# Patient Record
Sex: Female | Born: 1985 | Race: Black or African American | Hispanic: No | Marital: Married
Health system: Southern US, Community
[De-identification: ages and names within clinical notes are randomized; demographics above are authoritative.]

## PROBLEM LIST (undated history)

## (undated) DIAGNOSIS — Z789 Other specified health status: Secondary | ICD-10-CM

## (undated) HISTORY — PX: NO PAST SURGERIES: SHX2092

---

## 2012-09-30 ENCOUNTER — Encounter: Payer: BC Managed Care – PPO | Admitting: Obstetrics and Gynecology

## 2012-10-06 ENCOUNTER — Telehealth: Payer: Self-pay | Admitting: Obstetrics and Gynecology

## 2012-10-07 ENCOUNTER — Encounter: Payer: Self-pay | Admitting: Obstetrics and Gynecology

## 2012-10-07 ENCOUNTER — Ambulatory Visit: Payer: BC Managed Care – PPO | Admitting: Obstetrics and Gynecology

## 2012-10-07 VITALS — BP 110/68 | HR 82 | Ht 65.0 in | Wt 120.0 lb

## 2012-10-07 DIAGNOSIS — N898 Other specified noninflammatory disorders of vagina: Secondary | ICD-10-CM

## 2012-10-07 LAB — POCT WET PREP (WET MOUNT): pH: 5.5

## 2012-10-07 MED ORDER — TINIDAZOLE 500 MG PO TABS: 2.0000 g | ORAL_TABLET | Freq: Every day | ORAL | Status: DC

## 2012-10-07 MED ORDER — LEVONORGESTREL-ETHINYL ESTRAD 0.1-20 MG-MCG PO TABS: 1.0000 | ORAL_TABLET | Freq: Every day | ORAL | Status: DC

## 2012-10-07 NOTE — Addendum Note (Signed)
Addended by: Osborn Coho on: 10/07/2012 01:18 PM   Modules accepted: Orders

## 2012-10-07 NOTE — Addendum Note (Signed)
Addended by: Marla Roe A on: 10/07/2012 05:21 PM   Modules accepted: Orders

## 2012-10-07 NOTE — Progress Notes (Signed)
Contraception condoms  Last pap 01/2011 Last Mammo n/a Last Colonoscopy n/a Last Dexa Scan n/a Primary MD n/a Abuse at Home no   No complaints.  Wants to restart OCPs.  Filed Vitals:   10/07/12 1108  BP: 110/68  Pulse: 82   ROS: noncontributory  Physical Examination: General appearance - alert, well appearing, and in no distress Neck - supple, no significant adenopathy Chest - clear to auscultation, no wheezes, rales or rhonchi, symmetric air entry Heart - normal rate and regular rhythm Abdomen - soft, nontender, nondistended, no masses or organomegaly Breasts - breasts appear normal, no suspicious masses, no skin or nipple changes or axillary nodes Pelvic - normal external genitalia, vulva, vagina, cervix, uterus and adnexa, white d/c Back exam - no CVAT Extremities - no edema, redness or tenderness in the calves or thighs  A/P Used Aviane before and wants to restart - will send rx with refills for the yr Wet prep - BV- tindamax

## 2012-10-08 LAB — PAP IG W/ RFLX HPV ASCU

## 2018-04-16 LAB — OB RESULTS CONSOLE ANTIBODY SCREEN: Antibody Screen: NEGATIVE

## 2018-04-16 LAB — OB RESULTS CONSOLE HIV ANTIBODY (ROUTINE TESTING): HIV: NONREACTIVE

## 2018-04-16 LAB — OB RESULTS CONSOLE GC/CHLAMYDIA
Chlamydia: NEGATIVE
Gonorrhea: NEGATIVE

## 2018-04-16 LAB — OB RESULTS CONSOLE HEPATITIS B SURFACE ANTIGEN: Hepatitis B Surface Ag: NEGATIVE

## 2018-04-16 LAB — OB RESULTS CONSOLE ABO/RH: RH Type: POSITIVE

## 2018-04-16 LAB — OB RESULTS CONSOLE RPR: RPR: NONREACTIVE

## 2018-04-16 LAB — OB RESULTS CONSOLE RUBELLA ANTIBODY, IGM: Rubella: IMMUNE

## 2018-06-17 ENCOUNTER — Other Ambulatory Visit (HOSPITAL_COMMUNITY): Payer: Self-pay | Admitting: Obstetrics and Gynecology

## 2018-06-17 DIAGNOSIS — Z3A19 19 weeks gestation of pregnancy: Secondary | ICD-10-CM

## 2018-06-17 DIAGNOSIS — Z363 Encounter for antenatal screening for malformations: Secondary | ICD-10-CM

## 2018-06-30 ENCOUNTER — Encounter (HOSPITAL_COMMUNITY): Payer: Self-pay

## 2018-07-07 ENCOUNTER — Ambulatory Visit (HOSPITAL_COMMUNITY)
Admission: RE | Admit: 2018-07-07 | Discharge: 2018-07-07 | Disposition: A | Discharge status: Home / Self Care | Payer: Managed Care, Other (non HMO) | Source: Ambulatory Visit | Attending: Obstetrics and Gynecology | Admitting: Obstetrics and Gynecology

## 2018-07-07 DIAGNOSIS — Z363 Encounter for antenatal screening for malformations: Secondary | ICD-10-CM | POA: Insufficient documentation

## 2018-07-07 DIAGNOSIS — Z3A19 19 weeks gestation of pregnancy: Secondary | ICD-10-CM | POA: Insufficient documentation

## 2018-08-18 NOTE — L&D Delivery Note (Signed)
Delivery Note Patient pushed for approximately 30 minutes after she was noted to be C/C/+2. AROM was performed clear fluid. At 11:16 AM a viable and healthy female was delivered via Vaginal, Spontaneous (Presentation:OA). Shoulders and body easily delivered and baby laid on maternal abdomen. Baby noted to have vigorous cry.  APGAR: 9, 9; weight pending. Delayed cord clamping was done and then cut by father.  Cord blood obtained.    Placenta spontaneously delivered intact, 3 vessels.  There were no lacerations noted.  Uterine atony alleviated by IV pitocin and massage.  There were no complications.  COVID 19 precautions were taken  Anesthesia: pudendal  Episiotomy: None Lacerations: None Suture Repair: n/a Est. Blood Loss (mL): 50  Mom to postpartum.  Baby to Couplet care / Skin to Skin.  Essie Hart STACIA 11/16/2018, 11:38 AM

## 2018-11-03 LAB — OB RESULTS CONSOLE GBS: GBS: POSITIVE

## 2018-11-16 ENCOUNTER — Inpatient Hospital Stay (HOSPITAL_COMMUNITY)
Admission: AD | Admit: 2018-11-16 | Discharge: 2018-11-18 | DRG: 807 | Disposition: A | Discharge status: Home / Self Care | Payer: Managed Care, Other (non HMO) | Attending: Obstetrics & Gynecology | Admitting: Obstetrics & Gynecology

## 2018-11-16 ENCOUNTER — Encounter (HOSPITAL_COMMUNITY): Payer: Self-pay

## 2018-11-16 ENCOUNTER — Other Ambulatory Visit: Payer: Self-pay

## 2018-11-16 DIAGNOSIS — O26893 Other specified pregnancy related conditions, third trimester: Secondary | ICD-10-CM | POA: Diagnosis present

## 2018-11-16 DIAGNOSIS — Z3A37 37 weeks gestation of pregnancy: Secondary | ICD-10-CM

## 2018-11-16 DIAGNOSIS — O99824 Streptococcus B carrier state complicating childbirth: Principal | ICD-10-CM | POA: Diagnosis present

## 2018-11-16 HISTORY — DX: Other specified health status: Z78.9

## 2018-11-16 LAB — TYPE AND SCREEN
ABO/RH(D): B POS
Antibody Screen: NEGATIVE

## 2018-11-16 LAB — CBC
HEMATOCRIT: 41.8 % (ref 36.0–46.0)
Hemoglobin: 13.5 g/dL (ref 12.0–15.0)
MCH: 28.1 pg (ref 26.0–34.0)
MCHC: 32.3 g/dL (ref 30.0–36.0)
MCV: 86.9 fL (ref 80.0–100.0)
Platelets: 257 10*3/uL (ref 150–400)
RBC: 4.81 MIL/uL (ref 3.87–5.11)
RDW: 15.1 % (ref 11.5–15.5)
WBC: 10.2 10*3/uL (ref 4.0–10.5)
nRBC: 0 % (ref 0.0–0.2)

## 2018-11-16 MED ORDER — OXYTOCIN 40 UNITS IN NORMAL SALINE INFUSION - SIMPLE MED
INTRAVENOUS | Status: AC
  Filled 2018-11-16: qty 1000

## 2018-11-16 MED ORDER — DIPHENHYDRAMINE HCL 25 MG PO CAPS: 25.0000 mg | ORAL_CAPSULE | Freq: Four times a day (QID) | ORAL | Status: DC | PRN

## 2018-11-16 MED ORDER — COCONUT OIL OIL
1.0000 "application " | TOPICAL_OIL | Status: DC | PRN
  Administered 2018-11-17: 1 via TOPICAL

## 2018-11-16 MED ORDER — IBUPROFEN 600 MG PO TABS
600.0000 mg | ORAL_TABLET | Freq: Four times a day (QID) | ORAL | Status: DC
  Administered 2018-11-16 – 2018-11-18 (×7): 600 mg via ORAL
  Filled 2018-11-16 (×7): qty 1

## 2018-11-16 MED ORDER — OXYCODONE-ACETAMINOPHEN 5-325 MG PO TABS: 1.0000 | ORAL_TABLET | ORAL | Status: DC | PRN

## 2018-11-16 MED ORDER — LACTATED RINGERS IV SOLN
INTRAVENOUS | Status: DC
  Administered 2018-11-16: 11:00:00 via INTRAVENOUS

## 2018-11-16 MED ORDER — FLEET ENEMA 7-19 GM/118ML RE ENEM: 1.0000 | ENEMA | RECTAL | Status: DC | PRN

## 2018-11-16 MED ORDER — METHYLERGONOVINE MALEATE 0.2 MG PO TABS: 0.2000 mg | ORAL_TABLET | ORAL | Status: DC | PRN

## 2018-11-16 MED ORDER — DIBUCAINE 1 % RE OINT: 1.0000 "application " | TOPICAL_OINTMENT | RECTAL | Status: DC | PRN

## 2018-11-16 MED ORDER — SIMETHICONE 80 MG PO CHEW: 80.0000 mg | CHEWABLE_TABLET | ORAL | Status: DC | PRN

## 2018-11-16 MED ORDER — SOD CITRATE-CITRIC ACID 500-334 MG/5ML PO SOLN: 30.0000 mL | ORAL | Status: DC | PRN

## 2018-11-16 MED ORDER — ONDANSETRON HCL 4 MG PO TABS: 4.0000 mg | ORAL_TABLET | ORAL | Status: DC | PRN

## 2018-11-16 MED ORDER — LIDOCAINE HCL (PF) 1 % IJ SOLN
30.0000 mL | INTRAMUSCULAR | Status: AC | PRN
  Administered 2018-11-16: 30 mL via SUBCUTANEOUS

## 2018-11-16 MED ORDER — ACETAMINOPHEN 325 MG PO TABS
650.0000 mg | ORAL_TABLET | ORAL | Status: DC | PRN
  Administered 2018-11-16: 650 mg via ORAL
  Filled 2018-11-16: qty 2

## 2018-11-16 MED ORDER — TETANUS-DIPHTH-ACELL PERTUSSIS 5-2.5-18.5 LF-MCG/0.5 IM SUSP: 0.5000 mL | Freq: Once | INTRAMUSCULAR | Status: DC

## 2018-11-16 MED ORDER — WITCH HAZEL-GLYCERIN EX PADS: 1.0000 "application " | MEDICATED_PAD | CUTANEOUS | Status: DC | PRN

## 2018-11-16 MED ORDER — LACTATED RINGERS IV SOLN: INTRAVENOUS | Status: DC

## 2018-11-16 MED ORDER — CLINDAMYCIN PHOSPHATE 900 MG/50ML IV SOLN
900.0000 mg | Freq: Once | INTRAVENOUS | Status: AC
  Administered 2018-11-16: 900 mg via INTRAVENOUS
  Filled 2018-11-16: qty 50

## 2018-11-16 MED ORDER — OXYCODONE-ACETAMINOPHEN 5-325 MG PO TABS: 2.0000 | ORAL_TABLET | ORAL | Status: DC | PRN

## 2018-11-16 MED ORDER — PRENATAL MULTIVITAMIN CH
1.0000 | ORAL_TABLET | Freq: Every day | ORAL | Status: DC
  Administered 2018-11-17: 1 via ORAL
  Filled 2018-11-16: qty 1

## 2018-11-16 MED ORDER — BENZOCAINE-MENTHOL 20-0.5 % EX AERO
1.0000 "application " | INHALATION_SPRAY | CUTANEOUS | Status: DC | PRN
  Administered 2018-11-16: 1 via TOPICAL
  Filled 2018-11-16: qty 56

## 2018-11-16 MED ORDER — METHYLERGONOVINE MALEATE 0.2 MG/ML IJ SOLN: 0.2000 mg | INTRAMUSCULAR | Status: DC | PRN

## 2018-11-16 MED ORDER — LACTATED RINGERS IV SOLN: 500.0000 mL | INTRAVENOUS | Status: DC | PRN

## 2018-11-16 MED ORDER — ACETAMINOPHEN 325 MG PO TABS: 650.0000 mg | ORAL_TABLET | ORAL | Status: DC | PRN

## 2018-11-16 MED ORDER — SENNOSIDES-DOCUSATE SODIUM 8.6-50 MG PO TABS
2.0000 | ORAL_TABLET | ORAL | Status: DC
  Administered 2018-11-17 – 2018-11-18 (×2): 2 via ORAL
  Filled 2018-11-16 (×2): qty 2

## 2018-11-16 MED ORDER — OXYTOCIN BOLUS FROM INFUSION
500.0000 mL | Freq: Once | INTRAVENOUS | Status: AC
  Administered 2018-11-16: 500 mL/h via INTRAVENOUS

## 2018-11-16 MED ORDER — ONDANSETRON HCL 4 MG/2ML IJ SOLN: 4.0000 mg | Freq: Four times a day (QID) | INTRAMUSCULAR | Status: DC | PRN

## 2018-11-16 MED ORDER — ZOLPIDEM TARTRATE 5 MG PO TABS: 5.0000 mg | ORAL_TABLET | Freq: Every evening | ORAL | Status: DC | PRN

## 2018-11-16 MED ORDER — LIDOCAINE HCL (PF) 1 % IJ SOLN
INTRAMUSCULAR | Status: AC
  Filled 2018-11-16: qty 30

## 2018-11-16 MED ORDER — ONDANSETRON HCL 4 MG/2ML IJ SOLN: 4.0000 mg | INTRAMUSCULAR | Status: DC | PRN

## 2018-11-16 MED ORDER — OXYTOCIN 40 UNITS IN NORMAL SALINE INFUSION - SIMPLE MED: 2.5000 [IU]/h | INTRAVENOUS | Status: DC

## 2018-11-16 NOTE — MAU Note (Signed)
Contractions started at 0500.  No bleeding or leaking, small amt of pinkish mucous.  No problems with preg.  Was 1 cm last wk

## 2018-11-16 NOTE — H&P (Signed)
Jennifer May is a 33 y.o. female presenting for regular painful contractions that started at 0500 am this morning.  She advanced quickly in the MAU from 4cm to 8cm.   She denies leaking of fluid, reports good fetal movement Prenatal care only remarkable for presence of hemoglobin F.   OB History    Gravida  1   Para  1   Term  1   Preterm  0   AB  0   Living  1     SAB  0   TAB  0   Ectopic  0   Multiple  0   Live Births  1          Past Medical History:  Diagnosis Date  . Medical history non-contributory    Past Surgical History:  Procedure Laterality Date  . NO PAST SURGERIES     Family History: family history includes Diabetes in her maternal aunt and maternal grandmother; Hypertension in her mother. Social History:  reports that she has never smoked. She has never used smokeless tobacco. She reports previous alcohol use. She reports that she does not use drugs.     Maternal Diabetes: No Genetic Screening: Normal Quad screen negative Maternal Ultrasounds/Referrals: Normal Fetal Ultrasounds or other Referrals:  None Maternal Substance Abuse:  No Significant Maternal Medications:  None Significant Maternal Lab Results:  Lab values include: Group B Strep positive Other Comments:  None  ROS History Dilation: 10 Effacement (%): 100 Station: Plus 2 Exam by:: Dr Mora Appl Blood pressure 107/61, pulse 65, temperature 98.1 F (36.7 C), temperature source Oral, resp. rate 20, height 5\' 5"  (1.651 m), weight 77.1 kg, SpO2 97 %, unknown if currently breastfeeding. Exam Physical Exam  Prenatal labs: ABO, Rh: --/--/B POS, B POS Performed at [facility], [address], [place], [place] [address]  [phone]) Antibody: NEG (03/31 1031) Rubella: Immune (08/30 0000) RPR: Nonreactive (08/30 0000)  HBsAg: Negative (08/30 0000)  HIV: Non-reactive (08/30 0000)  GBS: Positive (03/18 0000)   Assessment/Plan: 33 yo G1P0 at 37 weeks 6 days in active  labor Admit to Labor and Delivery Continuous monitoring Admission labs IV hydration Epidural on demand   Ruffin Lada STACIA 11/16/2018, 5:44 PM

## 2018-11-16 NOTE — Lactation Note (Signed)
This note was copied from a baby's chart. Lactation Consultation Note  Patient Name: Jennifer May HAFBX'U Date: 11/16/2018 Reason for consult: Initial assessment;1st time breastfeeding;Early term 37-38.6wks P1, 10 hour female infant Per mom, infant had one stool since delivery. Mom made two attempts to breastfeed and infant been breast feed  3-times since delivery, L&D-10 minutes, 2nd times-2 minutes and 3rd time -10 minutes mom was still breastfeeding when LC left the room. Mom latched infant on right breast using football hold, after few attempts infant sustained latch, few swallows observed, infant was still breastfeeding when LC left room. When mom hand express a smear of colostrum present in left breast at this time. Mom knows how to hand express and LC notice mom is short shafted, LC gave breast shells and explained how to use, mom knows to wear in bra during the day and not sleep in them at night.  Mom knows to breastfeed according hunger cues, 8 or more times within 24 hours. Mom plans to use DEBP to help with breast stimulation and induction, mom plans to pump every 3 hours for 15 minutes. Nurse explained how to use DEBP, assemble, re-assemble and clean DEBP. LC discussed I & O. Mom knows to call Nurse or LC if she has any questions, concerns or need assistance with latching infant to breast. Mom goals: 1. BF according hunger cues, 8 or more times within 24 hours. 2. Mom will use DEBP every 3 hours for 15 minutes and will give back EBM. 3. Mom will do as much STS as possible.  Maternal Data Formula Feeding for Exclusion: No Has patient been taught Hand Expression?: Yes  Feeding Feeding Type: Breast Fed  LATCH Score Latch: Repeated attempts needed to sustain latch, nipple held in mouth throughout feeding, stimulation needed to elicit sucking reflex.  Audible Swallowing: A few with stimulation  Type of Nipple: Everted at rest and after stimulation(short shafted  )  Comfort (Breast/Nipple): Soft / non-tender  Hold (Positioning): Assistance needed to correctly position infant at breast and maintain latch.  LATCH Score: 7  Interventions Interventions: Breast feeding basics reviewed;Assisted with latch;Skin to skin;Breast massage;Hand express;Support pillows;Adjust position;Breast compression;DEBP  Lactation Tools Discussed/Used WIC Program: No Pump Review: Setup, frequency, and cleaning;Milk Storage Initiated by:: by Nurse Date initiated:: 11/16/18   Consult Status Consult Status: Follow-up Date: 11/17/19 Follow-up type: In-patient    Danelle Earthly 11/16/2018, 9:28 PM

## 2018-11-17 LAB — CBC
HCT: 32.7 % — ABNORMAL LOW (ref 36.0–46.0)
Hemoglobin: 10.6 g/dL — ABNORMAL LOW (ref 12.0–15.0)
MCH: 28 pg (ref 26.0–34.0)
MCHC: 32.4 g/dL (ref 30.0–36.0)
MCV: 86.5 fL (ref 80.0–100.0)
Platelets: 202 10*3/uL (ref 150–400)
RBC: 3.78 MIL/uL — ABNORMAL LOW (ref 3.87–5.11)
RDW: 15.2 % (ref 11.5–15.5)
WBC: 11.6 10*3/uL — ABNORMAL HIGH (ref 4.0–10.5)
nRBC: 0 % (ref 0.0–0.2)

## 2018-11-17 LAB — RPR: RPR Ser Ql: NONREACTIVE

## 2018-11-17 LAB — ABO/RH: ABO/RH(D): B POS

## 2018-11-17 NOTE — Lactation Note (Signed)
This note was copied from a baby's chart. Lactation Consultation Note  Patient Name: Jennifer May Date: 11/17/2018 Reason for consult: Follow-up assessment;Early term 37-38.6wks;Primapara;1st time breastfeeding  P1 mother whose infant is now 8 hours old.  RN requested latch assistance.  Mother's breasts are soft and non tender and nipples are everted and intact.  Mother stated she feels soreness to nipples but the nipples look healthy.  Encouraged her to use EBM to rub into nipples/areolas after breast feeding.  She also has comfort gels in the refrigerator and will use these after feeding.  I informed her that the RN can obtain coconut oil if desired.  Offered to assist with latching and mother accepted.  Positioned mother appropriately and assisted baby to latch in the football hold on the right breast easily.  Baby began rhythmically sucking.  Demonstrated breast compressions and mother able to perform.  Discussed breast feeding basics while observing baby breast feed.  Baby was still feeding when I left the room.  Encouraged to continue feeding 8-12 times/24 hours or sooner if baby shows cues.  Mother will hand express before/after feedings and feed back any EBM she obtains to baby.  Father present and supportive.  Mother will call as needed for latch assistance.  RN updated.   Maternal Data Formula Feeding for Exclusion: No Has patient been taught Hand Expression?: Yes Does the patient have breastfeeding experience prior to this delivery?: No  Feeding Feeding Type: Breast Fed  LATCH Score Latch: Grasps breast easily, tongue down, lips flanged, rhythmical sucking.  Audible Swallowing: A few with stimulation  Type of Nipple: Everted at rest and after stimulation  Comfort (Breast/Nipple): Soft / non-tender  Hold (Positioning): Assistance needed to correctly position infant at breast and maintain latch.  LATCH Score: 8  Interventions Interventions: Breast  feeding basics reviewed;Assisted with latch;Skin to skin;Breast massage;Hand express;Breast compression;Comfort gels;Position options;Support pillows;Adjust position  Lactation Tools Discussed/Used WIC Program: No   Consult Status Consult Status: Follow-up Date: 11/18/18 Follow-up type: In-patient    Yosselin Zoeller R Buena Boehm 11/17/2018, 9:08 PM

## 2018-11-17 NOTE — Progress Notes (Signed)
Post Partum Day 1  Subjective: no complaints, up ad lib, voiding and tolerating PO  Objective: Vitals:   11/16/18 1430 11/16/18 1935 11/16/18 2300 11/17/18 0335  BP: 107/61 123/79 93/62 98/66   Pulse: 65 69 75 70  Resp: 20 16 16 14   Temp: 98.1 F (36.7 C) 98.1 F (36.7 C) 98.1 F (36.7 C) 98.4 F (36.9 C)  TempSrc: Oral Oral Oral Oral  SpO2:  100% 100% 99%  Weight:      Height:        Physical Exam:  General: alert and cooperative Lochia: appropriate Uterine Fundus: firm Incision: n/a DVT Evaluation: No evidence of DVT seen on physical exam. Negative Homan's sign. No cords or calf tenderness. No significant calf/ankle edema.  Recent Labs    11/16/18 1031 11/17/18 0602  HGB 13.5 10.6*  HCT 41.8 32.7*    Assessment/Plan: Plan for discharge tomorrow and Breastfeeding   LOS: 1 day   Janeece Riggers 11/17/2018, 9:00 AM

## 2018-11-18 ENCOUNTER — Encounter: Payer: Self-pay | Admitting: Obstetrics and Gynecology

## 2018-11-18 ENCOUNTER — Encounter (HOSPITAL_COMMUNITY): Payer: Self-pay

## 2018-11-18 MED ORDER — IBUPROFEN 600 MG PO TABS: 600.0000 mg | ORAL_TABLET | Freq: Four times a day (QID) | ORAL | 0 refills | Status: DC | PRN

## 2018-11-18 NOTE — Discharge Summary (Signed)
OB Discharge Summary     Patient Name: Jennifer May DOB: 07/05/1986 MRN: [id number]  Date of admission: 11/16/2018 Delivering MD: Essie Hart   Date of discharge: 11/18/2018  Admitting diagnosis: 38WKS CTX LESS THAN 5 Intrauterine pregnancy: [redacted]w[redacted]d     Secondary diagnosis:  Active Problems:   Normal labor and delivery     Discharge diagnosis: Term Pregnancy Delivered                                                                                                Post partum procedures:n/a  Augmentation: AROM  Complications: None  Hospital course:  Onset of Labor With Vaginal Delivery     33 y.o. yo G1P1001 at [redacted]w[redacted]d was admitted in Active Labor on 11/16/2018. Patient had an uncomplicated labor course as follows:  Membrane Rupture Time/Date: 11:08 AM ,11/16/2018   Intrapartum Procedures: Episiotomy: None [1]                                         Lacerations:  None [1]  Patient had a delivery of a Viable infant. 11/16/2018  Information for the patient's newborn:  Zymira, Glassmeyer [[id number]]  Delivery Method: Vaginal, Spontaneous(Filed from Delivery Summary)    Pateint had an uncomplicated postpartum course.  She is ambulating, tolerating a regular diet, passing flatus, and urinating well. Patient is discharged home in stable condition on 11/18/18.   Physical exam  Vitals:   11/17/18 0335 11/17/18 1458 11/17/18 2222 11/18/18 0640  BP: 98/66 112/69 106/67 104/75  Pulse: 70 67 84 72  Resp: 14 16 16    Temp: 98.4 F (36.9 C) 98.6 F (37 C) 98 F (36.7 C) 98.2 F (36.8 C)  TempSrc: Oral Oral Oral Oral  SpO2: 99%     Weight:      Height:       General: alert, cooperative and no distress Lochia: appropriate Uterine Fundus: firm Incision: N/A DVT Evaluation: No evidence of DVT seen on physical exam. Negative Homan's sign. No cords or calf tenderness. No significant calf/ankle edema. Labs: Lab Results  Component Value Date   WBC 11.6 (H) 11/17/2018   HGB 10.6  (L) 11/17/2018   HCT 32.7 (L) 11/17/2018   MCV 86.5 11/17/2018   PLT 202 11/17/2018   No flowsheet data found.  Discharge instruction: per After Visit Summary and "Baby and Me Booklet".  After visit meds:  Allergies as of 11/18/2018   No Known Allergies     Medication List    TAKE these medications   calcium carbonate 500 MG chewable tablet Commonly known as:  TUMS - dosed in mg elemental calcium Chew 2 tablets by mouth daily as needed for indigestion or heartburn.   ibuprofen 600 MG tablet Commonly known as:  ADVIL,MOTRIN Take 1 tablet (600 mg total) by mouth every 6 (six) hours as needed for moderate pain.   IRON PO Take 1 tablet by mouth daily.   prenatal multivitamin Tabs tablet Take 1 tablet by mouth daily at 12  noon.       Diet: routine diet  Activity: Advance as tolerated. Pelvic rest for 6 weeks.   Outpatient follow up:6 weeks Follow up Appt:No future appointments. Follow up Visit:No follow-ups on file.  Postpartum contraception: Undecided  Newborn Data: Live born female  Birth Weight: 6 lb 8.4 oz (2960 g) APGAR: 9, 9  Newborn Delivery   Birth date/time:  11/16/2018 11:16:00 Delivery type:  Vaginal, Spontaneous     Baby Feeding: Breast Disposition:home with mother   11/18/2018 Janeece Riggers, CNM

## 2018-11-18 NOTE — Lactation Note (Signed)
This note was copied from a baby's chart. Lactation Consultation Note  Patient Name: Jennifer May MEQAS'T Date: 11/18/2018 Reason for consult: Follow-up assessment;Early term 37-38.6wks;1st time breastfeeding  Baby 45 hours old  Per mom  baby last fed at 6:55 an for 15 mins / increased swallows and milk is coming in/  Sore nippkes are better using the comfort gels and has shells.  LC reviewed sore nipple and engorgement prevention and tx . LC instructed on the use hand pump.  Discussed importance of STS feedings until the baby is back to birth weight / gaining steadily and can stay awake for  Majority of feeding. Discussed nutritive vs non - nutritive feeding patterns and the importance of watching the baby for  Hanging out latched.  Per mom has DEBP - Medela at home.  LC discussed weight loss at 8 % today and the correlation with the voids and stools.  LC recommended fort today to continue what she is doing feeding with cues and by 3 hours.  If by tomorrow the weight hasn't stabilized to add post pumping after 3-4 feedings a day and PRN if still full.  LC reviewed the potential feeding behavior of and ET infant and less than 6 pounds with 8% weight loss.  LC praised mom for her efforts breast feeding and dad for his support.  Mom aware the BFSG is one hold for now and [facility] phone calls and LC O/P still available.      Maternal Data    Feeding Feeding Type: Breast Fed  LATCH Score                   Interventions Interventions: Breast feeding basics reviewed  Lactation Tools Discussed/Used Tools: Shells;Pump Shell Type: Inverted Breast pump type: Manual   Consult Status Consult Status: Complete Date: 11/18/18    Kathrin Greathouse 11/18/2018, 9:09 AM

## 2018-11-18 NOTE — Discharge Instructions (Signed)
Postpartum Care After Vaginal Delivery ° °The period of time right after you deliver your newborn is called the postpartum period. °What kind of medical care will I receive? °· You may continue to receive fluids and medicines through an IV tube inserted into one of your veins. °· If an incision was made near your vagina (episiotomy) or if you had some vaginal tearing during delivery, cold compresses may be placed on your episiotomy or your tear. This helps to reduce pain and swelling. °· You may be given a squirt bottle to use when you go to the bathroom. You may use this until you are comfortable wiping as usual. To use the squirt bottle, follow these steps: °? Before you urinate, fill the squirt bottle with warm water. Do not use hot water. °? After you urinate, while you are sitting on the toilet, use the squirt bottle to rinse the area around your urethra and vaginal opening. This rinses away any urine and blood. °? You may do this instead of wiping. As you start healing, you may use the squirt bottle before wiping yourself. Make sure to wipe gently. °? Fill the squirt bottle with clean water every time you use the bathroom. °· You will be given sanitary pads to wear. °How can I expect to feel? °· You may not feel the need to urinate for several hours after delivery. °· You will have some soreness and pain in your abdomen and vagina. °· If you are breastfeeding, you may have uterine contractions every time you breastfeed for up to several weeks postpartum. Uterine contractions help your uterus return to its normal size. °· It is normal to have vaginal bleeding (lochia) after delivery. The amount and appearance of lochia is often similar to a menstrual period in the first week after delivery. It will gradually decrease over the next few weeks to a dry, yellow-brown discharge. For most women, lochia stops completely by 6-8 weeks after delivery. Vaginal bleeding can vary from woman to woman. °· Within the first few  days after delivery, you may have breast engorgement. This is when your breasts feel heavy, full, and uncomfortable. Your breasts may also throb and feel hard, tightly stretched, warm, and tender. After this occurs, you may have milk leaking from your breasts. Your health care provider can help you relieve discomfort due to breast engorgement. Breast engorgement should go away within a few days. °· You may feel more sad or worried than normal due to hormonal changes after delivery. These feelings should not last more than a few days. If these feelings do not go away after several days, speak with your health care provider. °How should I care for myself? °· Tell your health care provider if you have pain or discomfort. °· Drink enough water to keep your urine clear or pale yellow. °· Wash your hands thoroughly with soap and water for at least 20 seconds after changing your sanitary pads, after using the toilet, and before holding or feeding your baby. °· If you are not breastfeeding, avoid touching your breasts a lot. Doing this can make your breasts produce more milk. °· If you become weak or lightheaded, or you feel like you might faint, ask for help before: °? Getting out of bed. °? Showering. °· Change your sanitary pads frequently. Watch for any changes in your flow, such as a sudden increase in volume, a change in color, the passing of large blood clots. If you pass a blood clot from your vagina,   save it to show to your health care provider. Do not flush blood clots down the toilet without having your health care provider look at them. °· Make sure that all your vaccinations are up to date. This can help protect you and your baby from getting certain diseases. You may need to have immunizations done before you leave the hospital. °· If desired, talk with your health care provider about methods of family planning or birth control (contraception). °How can I start bonding with my baby? °Spending as much time as  possible with your baby is very important. During this time, you and your baby can get to know each other and develop a bond. Having your baby stay with you in your room (rooming in) can give you time to get to know your baby. Rooming in can also help you become comfortable caring for your baby. Breastfeeding can also help you bond with your baby. °How can I plan for returning home with my baby? °· Make sure that you have a car seat installed in your vehicle. °? Your car seat should be checked by a certified car seat installer to make sure that it is installed safely. °? Make sure that your baby fits into the car seat safely. °· Ask your health care provider any questions you have about caring for yourself or your baby. Make sure that you are able to contact your health care provider with any questions after leaving the hospital. °This information is not intended to replace advice given to you by your health care provider. Make sure you discuss any questions you have with your health care provider. °Document Released: 06/01/2007 Document Revised: 01/07/2016 Document Reviewed: 07/09/2015 °Elsevier Interactive Patient Education © 2018 [employer] ° ° °Postpartum Depression and Baby Blues °The postpartum period begins right after the birth of a baby. During this time, there is often a great amount of joy and excitement. It is also a time of many changes in the life of the parents. Regardless of how many times a mother gives birth, each child brings new challenges and dynamics to the family. It is not unusual to have feelings of excitement along with confusing shifts in moods, emotions, and thoughts. All mothers are at risk of developing postpartum depression or the "baby blues." These mood changes can occur right after giving birth, or they may occur many months after giving birth. The baby blues or postpartum depression can be mild or severe. Additionally, postpartum depression can go away rather quickly, or it can  be a long-term condition. °What are the causes? °Raised hormone levels and the rapid drop in those levels are thought to be a main cause of postpartum depression and the baby blues. A number of hormones change during and after pregnancy. Estrogen and progesterone usually decrease right after the delivery of your baby. The levels of thyroid hormone and various cortisol steroids also rapidly drop. Other factors that play a role in these mood changes include major life events and genetics. °What increases the risk? °If you have any of the following risks for the baby blues or postpartum depression, know what symptoms to watch out for during the postpartum period. Risk factors that may increase the likelihood of getting the baby blues or postpartum depression include: °· Having a personal or family history of depression. °· Having depression while being pregnant. °· Having premenstrual mood issues or mood issues related to oral contraceptives. °· Having a lot of life stress. °· Having marital conflict. °· Lacking   a social support network. °· Having a baby with special needs. °· Having health problems, such as diabetes. ° °What are the signs or symptoms? °Symptoms of baby blues include: °· Brief changes in mood, such as going from extreme happiness to sadness. °· Decreased concentration. °· Difficulty sleeping. °· Crying spells, tearfulness. °· Irritability. °· Anxiety. ° °Symptoms of postpartum depression typically begin within the first month after giving birth. These symptoms include: °· Difficulty sleeping or excessive sleepiness. °· Marked weight loss. °· Agitation. °· Feelings of worthlessness. °· Lack of interest in activity or food. ° °Postpartum psychosis is a very serious condition and can be dangerous. Fortunately, it is rare. Displaying any of the following symptoms is cause for immediate medical attention. Symptoms of postpartum psychosis include: °· Hallucinations and delusions. °· Bizarre or disorganized  behavior. °· Confusion or disorientation. ° °How is this diagnosed? °A diagnosis is made by an evaluation of your symptoms. There are no medical or lab tests that lead to a diagnosis, but there are various questionnaires that a health care provider may use to identify those with the baby blues, postpartum depression, or psychosis. Often, a screening tool called the Edinburgh Postnatal Depression Scale is used to diagnose depression in the postpartum period. °How is this treated? °The baby blues usually goes away on its own in 1-2 weeks. Social support is often all that is needed. You will be encouraged to get adequate sleep and rest. Occasionally, you may be given medicines to help you sleep. °Postpartum depression requires treatment because it can last several months or longer if it is not treated. Treatment may include individual or group therapy, medicine, or both to address any social, physiological, and psychological factors that may play a role in the depression. Regular exercise, a healthy diet, rest, and social support may also be strongly recommended. °Postpartum psychosis is more serious and needs treatment right away. Hospitalization is often needed. °Follow these instructions at home: °· Get as much rest as you can. Nap when the baby sleeps. °· Exercise regularly. Some women find yoga and walking to be beneficial. °· Eat a balanced and nourishing diet. °· Do little things that you enjoy. Have a cup of tea, take a bubble bath, read your favorite magazine, or listen to your favorite music. °· Avoid alcohol. °· Ask for help with household chores, cooking, grocery shopping, or running errands as needed. Do not try to do everything. °· Talk to people close to you about how you are feeling. Get support from your partner, family members, friends, or other new moms. °· Try to stay positive in how you think. Think about the things you are grateful for. °· Do not spend a lot of time alone. °· Only take  over-the-counter or prescription medicine as directed by your health care provider. °· Keep all your postpartum appointments. °· Let your health care provider know if you have any concerns. °Contact a health care provider if: °You are having a reaction to or problems with your medicine. °Get help right away if: °· You have suicidal feelings. °· You think you may harm the baby or someone else. °This information is not intended to replace advice given to you by your health care provider. Make sure you discuss any questions you have with your health care provider. °Document Released: 05/08/2004 Document Revised: 01/10/2016 Document Reviewed: 05/16/2013 °Elsevier Interactive Patient Education © 2017 [employer] ° ° °

## 2022-05-05 LAB — OB RESULTS CONSOLE GBS: GBS: POSITIVE

## 2022-08-08 LAB — HEPATITIS C ANTIBODY: HCV Ab: NEGATIVE

## 2022-08-08 LAB — OB RESULTS CONSOLE HEPATITIS B SURFACE ANTIGEN: Hepatitis B Surface Ag: NEGATIVE

## 2022-08-08 LAB — OB RESULTS CONSOLE RPR: RPR: NONREACTIVE

## 2022-08-08 LAB — OB RESULTS CONSOLE RUBELLA ANTIBODY, IGM: Rubella: IMMUNE

## 2022-08-08 LAB — OB RESULTS CONSOLE HIV ANTIBODY (ROUTINE TESTING): HIV: NONREACTIVE

## 2022-08-18 NOTE — L&D Delivery Note (Signed)
Delivery Note At 10:29 PM a viable and healthy female was delivered via Vaginal, Spontaneous (Presentation: Left Occiput Anterior).  APGAR: 9, 9; weight  pending.   Placenta status: Spontaneous, Intact.   Not sent Cord: 3 vessels with the following complications: None.  Cord pH: n/a  Anesthesia: None Episiotomy: None Lacerations: None Suture Repair:  n/a Est. Blood Loss (mL): 25  Mom to postpartum.  Baby to Couplet care / Skin to Skin.  Kaydie Petsch A Courteny Egler 05/06/2023, 10:44 PM

## 2023-04-16 LAB — OB RESULTS CONSOLE GBS: GBS: POSITIVE

## 2023-05-06 ENCOUNTER — Encounter (HOSPITAL_COMMUNITY): Payer: Self-pay

## 2023-05-06 ENCOUNTER — Inpatient Hospital Stay (HOSPITAL_COMMUNITY)
Admission: AD | Admit: 2023-05-06 | Discharge: 2023-05-08 | DRG: 807 | Disposition: A | Discharge status: Home / Self Care | Payer: Managed Care, Other (non HMO) | Attending: Obstetrics and Gynecology | Admitting: Obstetrics and Gynecology

## 2023-05-06 DIAGNOSIS — O99824 Streptococcus B carrier state complicating childbirth: Principal | ICD-10-CM | POA: Diagnosis present

## 2023-05-06 DIAGNOSIS — Z8249 Family history of ischemic heart disease and other diseases of the circulatory system: Secondary | ICD-10-CM

## 2023-05-06 DIAGNOSIS — Z23 Encounter for immunization: Secondary | ICD-10-CM | POA: Diagnosis not present

## 2023-05-06 DIAGNOSIS — Z833 Family history of diabetes mellitus: Secondary | ICD-10-CM

## 2023-05-06 DIAGNOSIS — O26893 Other specified pregnancy related conditions, third trimester: Secondary | ICD-10-CM | POA: Diagnosis present

## 2023-05-06 DIAGNOSIS — Z3A39 39 weeks gestation of pregnancy: Secondary | ICD-10-CM

## 2023-05-06 LAB — TYPE AND SCREEN
ABO/RH(D): B POS
Antibody Screen: NEGATIVE

## 2023-05-06 LAB — CBC
HCT: 37.2 % (ref 36.0–46.0)
Hemoglobin: 12.1 g/dL (ref 12.0–15.0)
MCH: 26.9 pg (ref 26.0–34.0)
MCHC: 32.5 g/dL (ref 30.0–36.0)
MCV: 82.9 fL (ref 80.0–100.0)
Platelets: 249 10*3/uL (ref 150–400)
RBC: 4.49 MIL/uL (ref 3.87–5.11)
RDW: 17.6 % — ABNORMAL HIGH (ref 11.5–15.5)
WBC: 6.1 10*3/uL (ref 4.0–10.5)
nRBC: 0 % (ref 0.0–0.2)

## 2023-05-06 LAB — OB RESULTS CONSOLE GBS: GBS: POSITIVE

## 2023-05-06 MED ORDER — LACTATED RINGERS IV SOLN: INTRAVENOUS | Status: DC

## 2023-05-06 MED ORDER — LACTATED RINGERS IV SOLN
500.0000 mL | INTRAVENOUS | Status: DC | PRN
  Administered 2023-05-06: 500 mL via INTRAVENOUS

## 2023-05-06 MED ORDER — SODIUM CHLORIDE 0.9 % IV SOLN
1.0000 g | INTRAVENOUS | Status: DC
  Filled 2023-05-06 (×2): qty 1000

## 2023-05-06 MED ORDER — SODIUM CHLORIDE 0.9 % IV SOLN
2.0000 g | Freq: Once | INTRAVENOUS | Status: AC
  Administered 2023-05-06: 2 g via INTRAVENOUS
  Filled 2023-05-06: qty 2000

## 2023-05-06 MED ORDER — OXYTOCIN-SODIUM CHLORIDE 30-0.9 UT/500ML-% IV SOLN
2.5000 [IU]/h | INTRAVENOUS | Status: DC
  Filled 2023-05-06: qty 500

## 2023-05-06 MED ORDER — OXYCODONE-ACETAMINOPHEN 5-325 MG PO TABS: 2.0000 | ORAL_TABLET | ORAL | Status: DC | PRN

## 2023-05-06 MED ORDER — ACETAMINOPHEN 325 MG PO TABS: 650.0000 mg | ORAL_TABLET | ORAL | Status: DC | PRN

## 2023-05-06 MED ORDER — ONDANSETRON HCL 4 MG/2ML IJ SOLN: 4.0000 mg | Freq: Four times a day (QID) | INTRAMUSCULAR | Status: DC | PRN

## 2023-05-06 MED ORDER — OXYTOCIN BOLUS FROM INFUSION
333.0000 mL | Freq: Once | INTRAVENOUS | Status: AC
  Administered 2023-05-06: 333 mL via INTRAVENOUS

## 2023-05-06 MED ORDER — LIDOCAINE HCL (PF) 1 % IJ SOLN: 30.0000 mL | INTRAMUSCULAR | Status: DC | PRN

## 2023-05-06 MED ORDER — OXYCODONE-ACETAMINOPHEN 5-325 MG PO TABS: 1.0000 | ORAL_TABLET | ORAL | Status: DC | PRN

## 2023-05-06 MED ORDER — FLEET ENEMA RE ENEM: 1.0000 | ENEMA | RECTAL | Status: DC | PRN

## 2023-05-06 MED ORDER — SOD CITRATE-CITRIC ACID 500-334 MG/5ML PO SOLN: 30.0000 mL | ORAL | Status: DC | PRN

## 2023-05-06 NOTE — H&P (Signed)
Jennifer May is Jennifer 37 y.o. female presenting  in active labor. Intact membrane (+) GBS. OB History     Gravida  2   Para  1   Term  1   Preterm  0   AB  0   Living  1      SAB  0   IAB  0   Ectopic  0   Multiple      Live Births  1          Past Medical History:  Diagnosis Date   Medical history non-contributory    Past Surgical History:  Procedure Laterality Date   NO PAST SURGERIES     Family History: family history includes Diabetes in her maternal aunt and maternal grandmother; Hypertension in her maternal aunt, maternal grandmother, and mother; Kidney disease in her maternal grandmother and mother. Social History:  reports that she has never smoked. She has never used smokeless tobacco. She reports that she does not currently use alcohol. She reports that she does not use drugs.     Maternal Diabetes: No Genetic Screening: Normal Maternal Ultrasounds/Referrals: Normal Fetal Ultrasounds or other Referrals:  None Maternal Substance Abuse:  No Significant Maternal Medications:  None Significant Maternal Lab Results:  Group B Strep positive Number of Prenatal Visits:greater than 3 verified prenatal visits Other Comments:  None  Review of Systems  All other systems reviewed and are negative.  History Dilation: 7.5 Effacement (%): 80 Station: -1 Exam by:: Jennifer English as Jennifer second language teacher Blood pressure 130/84, pulse 80, temperature 98.2 F (36.8 C), temperature source Oral, resp. rate 17, height 5' 5.5" (1.664 m), weight 88 kg, SpO2 99%, unknown if currently breastfeeding. Exam Physical Exam Constitutional:      Appearance: Normal appearance.  HENT:     Head: Atraumatic.  Eyes:     Extraocular Movements: Extraocular movements intact.  Cardiovascular:     Rate and Rhythm: Regular rhythm.  Pulmonary:     Breath sounds: Normal breath sounds.  Musculoskeletal:        General: Normal range of motion.     Cervical back: Neck supple.  Skin:    General:  Skin is warm and dry.  Neurological:     General: No focal deficit present.     Mental Status: She is alert.  Psychiatric:        Mood and Affect: Mood normal.        Behavior: Behavior normal.     Prenatal labs: ABO, Rh: --/--/B POS (09/18 1940) Antibody: NEG (09/18 1940) Rubella: Immune (12/22 0000) RPR: Nonreactive (12/22 0000)  HBsAg: Negative (12/22 0000)  HIV: Non-reactive (12/22 0000)  GBS: Positive/-- (09/18 0000)   Assessment/Plan: Active labor Term gestation  GBS cx positive P) admit routine labs, IV ampicillin, analgesic prn   Jennifer May Jennifer May 05/06/2023, 9:05 PM

## 2023-05-06 NOTE — MAU Note (Signed)
.  Jennifer May is a 37 y.o. at [redacted]w[redacted]d here in MAU reporting: contractions starting at 1330; reports became more intense around 5 pm; reports they are every 5-7 minutes. Reports bloody show  Denies LOF reports +FM   Onset of complaint: 1330 Pain score: 10/10 Vitals:   05/06/23 1927  BP: 109/77  Pulse: 79  Resp: 18  Temp: 98.2 F (36.8 C)  SpO2: 99%     BJY:NWGNFA straight back to room  Lab orders placed from triage:

## 2023-05-07 ENCOUNTER — Other Ambulatory Visit: Payer: Self-pay

## 2023-05-07 ENCOUNTER — Encounter (HOSPITAL_COMMUNITY): Payer: Self-pay | Admitting: Obstetrics and Gynecology

## 2023-05-07 LAB — CBC
HCT: 33.3 % — ABNORMAL LOW (ref 36.0–46.0)
Hemoglobin: 11 g/dL — ABNORMAL LOW (ref 12.0–15.0)
MCH: 27.5 pg (ref 26.0–34.0)
MCHC: 33 g/dL (ref 30.0–36.0)
MCV: 83.3 fL (ref 80.0–100.0)
Platelets: 224 10*3/uL (ref 150–400)
RBC: 4 MIL/uL (ref 3.87–5.11)
RDW: 17.6 % — ABNORMAL HIGH (ref 11.5–15.5)
WBC: 10.1 10*3/uL (ref 4.0–10.5)
nRBC: 0 % (ref 0.0–0.2)

## 2023-05-07 LAB — RPR: RPR Ser Ql: NONREACTIVE

## 2023-05-07 MED ORDER — ZOLPIDEM TARTRATE 5 MG PO TABS: 5.0000 mg | ORAL_TABLET | Freq: Every evening | ORAL | Status: DC | PRN

## 2023-05-07 MED ORDER — COCONUT OIL OIL
1.0000 | TOPICAL_OIL | Status: DC | PRN
  Administered 2023-05-07: 1 via TOPICAL

## 2023-05-07 MED ORDER — ONDANSETRON HCL 4 MG/2ML IJ SOLN: 4.0000 mg | INTRAMUSCULAR | Status: DC | PRN

## 2023-05-07 MED ORDER — FERROUS SULFATE 325 (65 FE) MG PO TABS
325.0000 mg | ORAL_TABLET | Freq: Two times a day (BID) | ORAL | Status: DC
  Administered 2023-05-07 – 2023-05-08 (×3): 325 mg via ORAL
  Filled 2023-05-07 (×3): qty 1

## 2023-05-07 MED ORDER — ONDANSETRON HCL 4 MG PO TABS: 4.0000 mg | ORAL_TABLET | ORAL | Status: DC | PRN

## 2023-05-07 MED ORDER — SIMETHICONE 80 MG PO CHEW: 80.0000 mg | CHEWABLE_TABLET | ORAL | Status: DC | PRN

## 2023-05-07 MED ORDER — PRENATAL MULTIVITAMIN CH
1.0000 | ORAL_TABLET | Freq: Every day | ORAL | Status: DC
  Administered 2023-05-07 – 2023-05-08 (×2): 1 via ORAL
  Filled 2023-05-07 (×2): qty 1

## 2023-05-07 MED ORDER — DIPHENHYDRAMINE HCL 25 MG PO CAPS: 25.0000 mg | ORAL_CAPSULE | Freq: Four times a day (QID) | ORAL | Status: DC | PRN

## 2023-05-07 MED ORDER — WITCH HAZEL-GLYCERIN EX PADS: 1.0000 | MEDICATED_PAD | CUTANEOUS | Status: DC | PRN

## 2023-05-07 MED ORDER — DIBUCAINE (PERIANAL) 1 % EX OINT: 1.0000 | TOPICAL_OINTMENT | CUTANEOUS | Status: DC | PRN

## 2023-05-07 MED ORDER — ACETAMINOPHEN 325 MG PO TABS: 650.0000 mg | ORAL_TABLET | ORAL | Status: DC | PRN

## 2023-05-07 MED ORDER — BENZOCAINE-MENTHOL 20-0.5 % EX AERO: 1.0000 | INHALATION_SPRAY | CUTANEOUS | Status: DC | PRN

## 2023-05-07 MED ORDER — SENNOSIDES-DOCUSATE SODIUM 8.6-50 MG PO TABS
2.0000 | ORAL_TABLET | Freq: Every day | ORAL | Status: DC
  Administered 2023-05-07 – 2023-05-08 (×2): 2 via ORAL
  Filled 2023-05-07 (×2): qty 2

## 2023-05-07 MED ORDER — IBUPROFEN 600 MG PO TABS
600.0000 mg | ORAL_TABLET | Freq: Four times a day (QID) | ORAL | Status: DC
  Administered 2023-05-07 (×3): 600 mg via ORAL
  Filled 2023-05-07 (×7): qty 1

## 2023-05-07 NOTE — Lactation Note (Signed)
This note was copied from a baby's chart. Lactation Consultation Note  Patient Name: Jennifer May [id number] Date: 05/07/2023 Age:37 hours  Reason for consult: Initial assessment;Term;Breastfeeding assistance  P2, [redacted]w[redacted]d  Initial LC visit to see P2 mother of term infant. She says baby is breastfeeding well, shorter length of time and frequently. Mother receptive to visit and wanted help/ review with basic breastfeeding education, position and latch. Hand expression taught. Colostrum was not expressed. Mother encouraged to do hand expression after the feeding. Baby latched and feeding well.   Encouraged to latch baby with feeding cues, place baby skin to skin if not latching.  Call for assistance with breastfeeding, as needed.  Anticipate as baby approaches 24 hours of age, baby will feed more often at breast, 8-12 plus times in 24 hours and "cluster feed"      Mom made aware of O/P services, breastfeeding support groups, community resources, and our phone # for post-discharge questions.    .  Maternal Data Has patient been taught Hand Expression?: Yes Does the patient have breastfeeding experience prior to this delivery?: Yes How long did the patient breastfeed?: 1 year  Feeding Mother's Current Feeding Choice: Breast Milk  LATCH Score Latch: Grasps breast easily, tongue down, lips flanged, rhythmical sucking.  Audible Swallowing: A few with stimulation  Type of Nipple: Everted at rest and after stimulation  Comfort (Breast/Nipple): Soft / non-tender  Hold (Positioning): Assistance needed to correctly position infant at breast and maintain latch.  LATCH Score: 8   Lactation Tools Discussed/Used    Interventions Interventions: Breast feeding basics reviewed;Assisted with latch;Skin to skin;Breast massage;Hand express;Breast compression;Support pillows;Position options;Education;LC Services brochure  Discharge Pump: DEBP;Personal  Consult Status Consult Status:  Follow-up Date: 05/08/23 Follow-up type: In-patient    Christella Hartigan M 05/07/2023, 4:24 PM

## 2023-05-07 NOTE — Progress Notes (Signed)
PPD1 SVD:   S:  Pt reports feeling well/ Tolerating po/ Voiding without problems/ No n/v/ Bleeding is light/ Pain controlled withprescription NSAID's including ibuprofen (Motrin)  Newborn info live female BRF   O:  A & O x 3 / VS: Blood pressure 105/65, pulse 68, temperature 97.8 F (36.6 C), temperature source Oral, resp. rate 17, height 5' 5.5" (1.664 m), weight 88 kg, SpO2 100%, unknown if currently breastfeeding.  LABS:  Results for orders placed or performed during the hospital encounter of 05/06/23 (from the past 24 hour(s))  CBC     Status: Abnormal   Collection Time: 05/06/23  7:39 PM  Result Value Ref Range   WBC 6.1 4.0 - 10.5 K/uL   RBC 4.49 3.87 - 5.11 MIL/uL   Hemoglobin 12.1 12.0 - 15.0 g/dL   HCT 82.9 56.2 - 13.0 %   MCV 82.9 80.0 - 100.0 fL   MCH 26.9 26.0 - 34.0 pg   MCHC 32.5 30.0 - 36.0 g/dL   RDW 86.5 (H) 78.4 - 69.6 %   Platelets 249 150 - 400 K/uL   nRBC 0.0 0.0 - 0.2 %  RPR     Status: None   Collection Time: 05/06/23  7:39 PM  Result Value Ref Range   RPR Ser Ql NON REACTIVE NON REACTIVE  Type and screen [facility]     Status: None   Collection Time: 05/06/23  7:40 PM  Result Value Ref Range   ABO/RH(D) B POS    Antibody Screen NEG    Sample Expiration      05/09/2023,2359 Performed at [facility], [address] [place], [place] [address]   CBC     Status: Abnormal   Collection Time: 05/07/23  5:37 AM  Result Value Ref Range   WBC 10.1 4.0 - 10.5 K/uL   RBC 4.00 3.87 - 5.11 MIL/uL   Hemoglobin 11.0 (L) 12.0 - 15.0 g/dL   HCT 41.3 (L) 24.4 - 01.0 %   MCV 83.3 80.0 - 100.0 fL   MCH 27.5 26.0 - 34.0 pg   MCHC 33.0 30.0 - 36.0 g/dL   RDW 27.2 (H) 53.6 - 64.4 %   Platelets 224 150 - 400 K/uL   nRBC 0.0 0.0 - 0.2 %    I&O: I/O last 3 completed shifts: In: -  Out: 25 [Blood:25]   No intake/output data recorded.  Lungs: chest clear, no wheezing, rales, normal symmetric air entry  Heart: regular rate and  rhythm  Abdomen: uterus firm 2 FB below umbilcus  Perineum: is normal  Lochia: light  Extremities:no redness or tenderness in the calves or thighs, no edema    A/P: PPD # 1/ [id number]  Doing well  Continue routine post partum orders  Anticipate d/c in am

## 2023-05-08 MED ORDER — INFLUENZA VIRUS VACC SPLIT PF (FLUZONE) 0.5 ML IM SUSY
0.5000 mL | PREFILLED_SYRINGE | Freq: Once | INTRAMUSCULAR | Status: AC
  Administered 2023-05-08: 0.5 mL via INTRAMUSCULAR
  Filled 2023-05-08: qty 0.5

## 2023-05-08 MED ORDER — INFLUENZA VIRUS VACC SPLIT PF (FLUZONE) 0.5 ML IM SUSY: 0.5000 mL | PREFILLED_SYRINGE | INTRAMUSCULAR | Status: DC

## 2023-05-08 NOTE — Discharge Instructions (Signed)
Call if temperature greater than equal to 100.4, nothing per vagina for 4-6 weeks or severe nausea vomiting, increased incisional pain , drainage or redness in the incision site, no straining with bowel movements, showers no bath

## 2023-05-08 NOTE — Progress Notes (Signed)
PPD2 SVD:   S:  Pt reports feeling well/ Tolerating po/ Voiding without problems/ No n/v/ Bleeding is light/ Pain controlled withprescription NSAID's including ibuprofen (Motrin)  Newborn info live female BRF   O:  A & O x 3 / VS: Blood pressure (!) 98/57, pulse 86, temperature 97.9 F (36.6 C), temperature source Oral, resp. rate 17, height 5' 5.5" (1.664 m), weight 88 kg, SpO2 98%, unknown if currently breastfeeding.  LABS: No results found for this or any previous visit (from the past 24 hour(s)).  I&O: I/O last 3 completed shifts: In: -  Out: 25 [Blood:25]   No intake/output data recorded.  Lungs: chest clear, no wheezing, rales, normal symmetric air entry  Heart: regular rate and rhythm, S1, S2 normal, no murmur, click, rub or gallop  Abdomen: uterus firm 2 fb below  Perineum: not inspected  Lochia: light  Extremities:no redness or tenderness in the calves or thighs, no edema    A/P: PPD # 2/ [id number]  Doing well  Continue routine post partum orders  D/c instructions reviewed F/u 6 wk

## 2023-05-08 NOTE — Discharge Summary (Signed)
Postpartum Discharge Summary  Date of Service updated     Patient Name: Jennifer May DOB: Mar 16, 1986 MRN: [id number]  Date of admission: 05/06/2023 Delivery date:05/06/2023 Delivering provider: Wladyslaw Henrichs Date of discharge: 05/08/2023  Admitting diagnosis: Normal labor and delivery [O80] Indication for care in labor and delivery, antepartum [O75.9] Postpartum care following vaginal delivery [Z39.2] Intrauterine pregnancy: [redacted]w[redacted]d     Secondary diagnosis:  Principal Problem:   Normal labor and delivery Active Problems:   Indication for care in labor and delivery, antepartum   Postpartum care following vaginal delivery  Additional problems: GBS cx positive    Discharge diagnosis: Term Pregnancy Delivered and GBS cx positive                                               Post partum procedures: n/a Augmentation: N/A Complications: None  Hospital course: Onset of Labor With Vaginal Delivery      37 y.o. yo [id number] at [redacted]w[redacted]d was admitted in Active Labor on 05/06/2023. Labor course was complicated by none  Membrane Rupture Time/Date: 10:28 PM,05/06/2023  Delivery Method:Vaginal, Spontaneous Operative Delivery:N/A Episiotomy: None Lacerations:  None Patient had a postpartum course complicated by  none.  She is ambulating, tolerating a regular diet, passing flatus, and urinating well. Patient is discharged home in stable condition on 05/08/23.  Newborn Data: Birth date:05/06/2023 Birth time:10:29 PM Gender:Female Living status:Living Apgars:9 ,9  Weight:3.3 kg  Magnesium Sulfate received: No BMZ received: No Rhophylac:No MMR:No T-DaP:Given prenatally Flu: No RSV Vaccine received: No Transfusion:No Immunizations administered: There is no immunization history for the selected administration types on file for this patient.  Physical exam  Vitals:   05/07/23 0958 05/07/23 1304 05/07/23 2149 05/08/23 0533  BP: 108/73 102/70 101/77 (!) 98/57  Pulse: 82  71 86   Resp: 18 18 18 17   Temp: 98.7 F (37.1 C)  97.9 F (36.6 C)   TempSrc: Oral  Oral   SpO2: 100% 100%  98%  Weight:      Height:       General: alert, cooperative, and no distress Lochia: appropriate Uterine Fundus: firm Incision: N/A DVT Evaluation: No evidence of DVT seen on physical exam. Labs: Lab Results  Component Value Date   WBC 10.1 05/07/2023   HGB 11.0 (L) 05/07/2023   HCT 33.3 (L) 05/07/2023   MCV 83.3 05/07/2023   PLT 224 05/07/2023       No data to display         Edinburgh Score:    05/07/2023    9:50 PM  Edinburgh Postnatal Depression Scale Screening Tool  I have been able to laugh and see the funny side of things. 0  I have looked forward with enjoyment to things. 0  I have blamed myself unnecessarily when things went wrong. 0  I have been anxious or worried for no good reason. 0  I have felt scared or panicky for no good reason. 0  Things have been getting on top of me. 0  I have been so unhappy that I have had difficulty sleeping. 0  I have felt sad or miserable. 0  I have been so unhappy that I have been crying. 0  The thought of harming myself has occurred to me. 0  Edinburgh Postnatal Depression Scale Total 0      After visit meds:  Allergies as of 05/08/2023   No Known Allergies      Medication List     TAKE these medications    prenatal multivitamin Tabs tablet Take 1 tablet by mouth daily at 12 noon.         Discharge home in stable condition Infant Feeding: Breast Infant Disposition:home with mother Discharge instruction: per After Visit Summary and Postpartum booklet. Activity: Advance as tolerated. Pelvic rest for 6 weeks.  Diet: routine diet Anticipated Birth Control: Unsure Postpartum Appointment:6 weeks Additional Postpartum F/U:  none Future Appointments:No future appointments. Follow up Visit:  Follow-up Information     Maxie Better, MD Follow up in 6 week(s).   Specialty: Obstetrics and  Gynecology Contact information: [address] [place] [address] [place] [address] [phone]                     05/08/2023 Serita Kyle, MD

## 2023-05-08 NOTE — Lactation Note (Signed)
This note was copied from a baby's chart. Lactation Consultation Note  Patient Name: Jennifer May [id number] Date: 05/08/2023 Age:37 hours Reason for consult: Follow-up assessment  P1, Answered questions about cluster feeding. Feed on demand with cues.  Goal 8-12+ times per day after first 24 hrs.   Encouraged offering both breasts per session. Reviewed engorgement care and monitoring voids/stools.  Maternal Data Has patient been taught Hand Expression?: Yes  Feeding Mother's Current Feeding Choice: Breast Milk and Donor Milk  Interventions Interventions: Breast feeding basics reviewed;Education  Discharge Discharge Education: Engorgement and breast care;Warning signs for feeding baby Pump: Personal;DEBP  Consult Status Consult Status: Complete Date: 05/08/23    [name] [facility] 05/08/2023, 10:49 AM

## 2023-06-03 ENCOUNTER — Telehealth (HOSPITAL_COMMUNITY): Payer: Self-pay

## 2023-06-03 NOTE — Telephone Encounter (Signed)
06/03/2023 1142  Name: Dean Wonder MRN: [id number] DOB: 18-May-1986  Reason for Call:  Transition of Care Hospital Discharge Call  Contact Status: Patient Contact Status: Message  Language assistant needed: Interpreter Mode: Interpreter Not Needed        Follow-Up Questions:    Edinburgh Postnatal Depression Scale:  In the Past 7 Days:    PHQ2-9 Depression Scale:     Discharge Follow-up:    Post-discharge interventions: NA  Signature  Signe Colt
